# Patient Record
Sex: Female | Born: 1995
Health system: Southern US, Community
[De-identification: ages and names within clinical notes are randomized; demographics above are authoritative.]

## PROBLEM LIST (undated history)

## (undated) DIAGNOSIS — F419 Anxiety disorder, unspecified: Secondary | ICD-10-CM

## (undated) DIAGNOSIS — F32A Depression, unspecified: Secondary | ICD-10-CM

## (undated) DIAGNOSIS — T7840XA Allergy, unspecified, initial encounter: Secondary | ICD-10-CM

## (undated) DIAGNOSIS — F329 Major depressive disorder, single episode, unspecified: Secondary | ICD-10-CM

## (undated) HISTORY — DX: Depression, unspecified: F32.A

## (undated) HISTORY — DX: Allergy, unspecified, initial encounter: T78.40XA

## (undated) HISTORY — DX: Anxiety disorder, unspecified: F41.9

## (undated) HISTORY — PX: TONSILLECTOMY AND ADENOIDECTOMY: SUR1326

## (undated) HISTORY — DX: Major depressive disorder, single episode, unspecified: F32.9

---
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
## 2016-05-23 DIAGNOSIS — Z01419 Encounter for gynecological examination (general) (routine) without abnormal findings: Secondary | ICD-10-CM | POA: Diagnosis not present

## 2016-05-23 DIAGNOSIS — Z6841 Body Mass Index (BMI) 40.0 and over, adult: Secondary | ICD-10-CM | POA: Diagnosis not present

## 2016-05-23 DIAGNOSIS — Z113 Encounter for screening for infections with a predominantly sexual mode of transmission: Secondary | ICD-10-CM | POA: Diagnosis not present

## 2016-12-08 DIAGNOSIS — D485 Neoplasm of uncertain behavior of skin: Secondary | ICD-10-CM | POA: Diagnosis not present

## 2016-12-08 DIAGNOSIS — D225 Melanocytic nevi of trunk: Secondary | ICD-10-CM | POA: Diagnosis not present

## 2016-12-10 DIAGNOSIS — S99912A Unspecified injury of left ankle, initial encounter: Secondary | ICD-10-CM | POA: Diagnosis not present

## 2016-12-10 DIAGNOSIS — M25572 Pain in left ankle and joints of left foot: Secondary | ICD-10-CM | POA: Diagnosis not present

## 2016-12-10 DIAGNOSIS — Z6841 Body Mass Index (BMI) 40.0 and over, adult: Secondary | ICD-10-CM | POA: Diagnosis not present

## 2016-12-10 DIAGNOSIS — S93402A Sprain of unspecified ligament of left ankle, initial encounter: Secondary | ICD-10-CM | POA: Diagnosis not present

## 2016-12-10 DIAGNOSIS — M7989 Other specified soft tissue disorders: Secondary | ICD-10-CM | POA: Diagnosis not present

## 2017-01-29 DIAGNOSIS — J040 Acute laryngitis: Secondary | ICD-10-CM | POA: Diagnosis not present

## 2017-01-29 DIAGNOSIS — J40 Bronchitis, not specified as acute or chronic: Secondary | ICD-10-CM | POA: Diagnosis not present

## 2017-04-10 DIAGNOSIS — J3089 Other allergic rhinitis: Secondary | ICD-10-CM | POA: Diagnosis not present

## 2017-04-10 DIAGNOSIS — J3081 Allergic rhinitis due to animal (cat) (dog) hair and dander: Secondary | ICD-10-CM | POA: Diagnosis not present

## 2017-04-10 DIAGNOSIS — J301 Allergic rhinitis due to pollen: Secondary | ICD-10-CM | POA: Diagnosis not present

## 2017-04-10 DIAGNOSIS — R05 Cough: Secondary | ICD-10-CM | POA: Diagnosis not present

## 2017-04-17 DIAGNOSIS — J301 Allergic rhinitis due to pollen: Secondary | ICD-10-CM | POA: Diagnosis not present

## 2017-04-17 DIAGNOSIS — J3081 Allergic rhinitis due to animal (cat) (dog) hair and dander: Secondary | ICD-10-CM | POA: Diagnosis not present

## 2017-04-18 DIAGNOSIS — J3089 Other allergic rhinitis: Secondary | ICD-10-CM | POA: Diagnosis not present

## 2017-04-27 DIAGNOSIS — J3089 Other allergic rhinitis: Secondary | ICD-10-CM | POA: Diagnosis not present

## 2017-04-27 DIAGNOSIS — J3081 Allergic rhinitis due to animal (cat) (dog) hair and dander: Secondary | ICD-10-CM | POA: Diagnosis not present

## 2017-04-27 DIAGNOSIS — J301 Allergic rhinitis due to pollen: Secondary | ICD-10-CM | POA: Diagnosis not present

## 2017-05-01 DIAGNOSIS — J3081 Allergic rhinitis due to animal (cat) (dog) hair and dander: Secondary | ICD-10-CM | POA: Diagnosis not present

## 2017-05-01 DIAGNOSIS — J301 Allergic rhinitis due to pollen: Secondary | ICD-10-CM | POA: Diagnosis not present

## 2017-05-01 DIAGNOSIS — J3089 Other allergic rhinitis: Secondary | ICD-10-CM | POA: Diagnosis not present

## 2017-05-03 DIAGNOSIS — J301 Allergic rhinitis due to pollen: Secondary | ICD-10-CM | POA: Diagnosis not present

## 2017-05-03 DIAGNOSIS — J3081 Allergic rhinitis due to animal (cat) (dog) hair and dander: Secondary | ICD-10-CM | POA: Diagnosis not present

## 2017-05-03 DIAGNOSIS — J3089 Other allergic rhinitis: Secondary | ICD-10-CM | POA: Diagnosis not present

## 2017-05-10 DIAGNOSIS — J3089 Other allergic rhinitis: Secondary | ICD-10-CM | POA: Diagnosis not present

## 2017-05-10 DIAGNOSIS — J301 Allergic rhinitis due to pollen: Secondary | ICD-10-CM | POA: Diagnosis not present

## 2017-05-10 DIAGNOSIS — J3081 Allergic rhinitis due to animal (cat) (dog) hair and dander: Secondary | ICD-10-CM | POA: Diagnosis not present

## 2017-05-17 DIAGNOSIS — J301 Allergic rhinitis due to pollen: Secondary | ICD-10-CM | POA: Diagnosis not present

## 2017-05-17 DIAGNOSIS — J3089 Other allergic rhinitis: Secondary | ICD-10-CM | POA: Diagnosis not present

## 2017-05-17 DIAGNOSIS — J3081 Allergic rhinitis due to animal (cat) (dog) hair and dander: Secondary | ICD-10-CM | POA: Diagnosis not present

## 2017-05-19 DIAGNOSIS — J3089 Other allergic rhinitis: Secondary | ICD-10-CM | POA: Diagnosis not present

## 2017-05-19 DIAGNOSIS — J3081 Allergic rhinitis due to animal (cat) (dog) hair and dander: Secondary | ICD-10-CM | POA: Diagnosis not present

## 2017-05-19 DIAGNOSIS — J301 Allergic rhinitis due to pollen: Secondary | ICD-10-CM | POA: Diagnosis not present

## 2017-05-22 DIAGNOSIS — J3081 Allergic rhinitis due to animal (cat) (dog) hair and dander: Secondary | ICD-10-CM | POA: Diagnosis not present

## 2017-05-22 DIAGNOSIS — J3089 Other allergic rhinitis: Secondary | ICD-10-CM | POA: Diagnosis not present

## 2017-05-22 DIAGNOSIS — J301 Allergic rhinitis due to pollen: Secondary | ICD-10-CM | POA: Diagnosis not present

## 2017-05-25 DIAGNOSIS — J3081 Allergic rhinitis due to animal (cat) (dog) hair and dander: Secondary | ICD-10-CM | POA: Diagnosis not present

## 2017-05-25 DIAGNOSIS — J301 Allergic rhinitis due to pollen: Secondary | ICD-10-CM | POA: Diagnosis not present

## 2017-05-25 DIAGNOSIS — J3089 Other allergic rhinitis: Secondary | ICD-10-CM | POA: Diagnosis not present

## 2017-06-01 DIAGNOSIS — J3081 Allergic rhinitis due to animal (cat) (dog) hair and dander: Secondary | ICD-10-CM | POA: Diagnosis not present

## 2017-06-01 DIAGNOSIS — J3089 Other allergic rhinitis: Secondary | ICD-10-CM | POA: Diagnosis not present

## 2017-06-01 DIAGNOSIS — J301 Allergic rhinitis due to pollen: Secondary | ICD-10-CM | POA: Diagnosis not present

## 2017-06-07 DIAGNOSIS — J3089 Other allergic rhinitis: Secondary | ICD-10-CM | POA: Diagnosis not present

## 2017-06-07 DIAGNOSIS — J3081 Allergic rhinitis due to animal (cat) (dog) hair and dander: Secondary | ICD-10-CM | POA: Diagnosis not present

## 2017-06-07 DIAGNOSIS — J301 Allergic rhinitis due to pollen: Secondary | ICD-10-CM | POA: Diagnosis not present

## 2017-06-08 ENCOUNTER — Ambulatory Visit: Payer: Self-pay | Admitting: Physician Assistant

## 2017-06-17 DIAGNOSIS — Z30430 Encounter for insertion of intrauterine contraceptive device: Secondary | ICD-10-CM | POA: Diagnosis not present

## 2017-06-17 DIAGNOSIS — Z3202 Encounter for pregnancy test, result negative: Secondary | ICD-10-CM | POA: Diagnosis not present

## 2017-06-30 DIAGNOSIS — J3089 Other allergic rhinitis: Secondary | ICD-10-CM | POA: Diagnosis not present

## 2017-06-30 DIAGNOSIS — J3081 Allergic rhinitis due to animal (cat) (dog) hair and dander: Secondary | ICD-10-CM | POA: Diagnosis not present

## 2017-06-30 DIAGNOSIS — J301 Allergic rhinitis due to pollen: Secondary | ICD-10-CM | POA: Diagnosis not present

## 2017-07-05 ENCOUNTER — Encounter: Payer: Self-pay | Admitting: Physician Assistant

## 2017-07-07 DIAGNOSIS — J301 Allergic rhinitis due to pollen: Secondary | ICD-10-CM | POA: Diagnosis not present

## 2017-07-07 DIAGNOSIS — J3089 Other allergic rhinitis: Secondary | ICD-10-CM | POA: Diagnosis not present

## 2017-07-07 DIAGNOSIS — J3081 Allergic rhinitis due to animal (cat) (dog) hair and dander: Secondary | ICD-10-CM | POA: Diagnosis not present

## 2017-07-13 DIAGNOSIS — J3081 Allergic rhinitis due to animal (cat) (dog) hair and dander: Secondary | ICD-10-CM | POA: Diagnosis not present

## 2017-07-13 DIAGNOSIS — J301 Allergic rhinitis due to pollen: Secondary | ICD-10-CM | POA: Diagnosis not present

## 2017-07-13 DIAGNOSIS — J3089 Other allergic rhinitis: Secondary | ICD-10-CM | POA: Diagnosis not present

## 2017-07-19 ENCOUNTER — Ambulatory Visit (INDEPENDENT_AMBULATORY_CARE_PROVIDER_SITE_OTHER): Payer: BLUE CROSS/BLUE SHIELD | Admitting: Physician Assistant

## 2017-07-19 ENCOUNTER — Encounter: Payer: Self-pay | Admitting: Physician Assistant

## 2017-07-19 VITALS — BP 123/80 | HR 93 | Temp 98.0°F | Ht 65.0 in | Wt 321.8 lb

## 2017-07-19 DIAGNOSIS — Z6841 Body Mass Index (BMI) 40.0 and over, adult: Secondary | ICD-10-CM | POA: Diagnosis not present

## 2017-07-19 DIAGNOSIS — R635 Abnormal weight gain: Secondary | ICD-10-CM

## 2017-07-19 DIAGNOSIS — J3081 Allergic rhinitis due to animal (cat) (dog) hair and dander: Secondary | ICD-10-CM | POA: Diagnosis not present

## 2017-07-19 DIAGNOSIS — Z01419 Encounter for gynecological examination (general) (routine) without abnormal findings: Secondary | ICD-10-CM | POA: Diagnosis not present

## 2017-07-19 DIAGNOSIS — J3089 Other allergic rhinitis: Secondary | ICD-10-CM | POA: Diagnosis not present

## 2017-07-19 DIAGNOSIS — J301 Allergic rhinitis due to pollen: Secondary | ICD-10-CM | POA: Diagnosis not present

## 2017-07-19 MED ORDER — BUPROPION HCL ER (XL) 150 MG PO TB24: 150.0000 mg | ORAL_TABLET | Freq: Every day | ORAL | 1 refills | Status: DC

## 2017-07-19 MED ORDER — FLUOXETINE HCL 40 MG PO CAPS: ORAL_CAPSULE | ORAL | 3 refills | Status: DC

## 2017-07-19 NOTE — Patient Instructions (Signed)
Breakfast: eggs 2-3 Or greek yogurt low fat DANNON 1 slice delightful [employer] bread  Lunch: 2 slice [employer] delightfully bread       Or Nature's Own Light 4 ounces chicken, turkey, roast beef 1 slice Thin sliced cheese Sargento Mustard ok 1 piece of fruit  Supper: 6 ounces lean meat 2 cups raw/cooked veg or 1 cup pintos, corn lima  3 snacks 100 calories or less  

## 2017-07-20 ENCOUNTER — Telehealth: Payer: Self-pay | Admitting: Physician Assistant

## 2017-07-20 ENCOUNTER — Other Ambulatory Visit: Payer: BLUE CROSS/BLUE SHIELD

## 2017-07-20 DIAGNOSIS — R635 Abnormal weight gain: Secondary | ICD-10-CM

## 2017-07-20 DIAGNOSIS — Z01419 Encounter for gynecological examination (general) (routine) without abnormal findings: Secondary | ICD-10-CM

## 2017-07-20 NOTE — Progress Notes (Signed)
BP 123/80   Pulse 93   Temp 98 F (36.7 C) (Oral)   Ht 5' 5"  (1.651 m)   Wt (!) 321 lb 12.8 oz (146 kg)   BMI 53.55 kg/m    Subjective:    Patient ID: Tiffany Dalton, female    DOB: May 06, 1996, 21 y.o.   MRN: [id number]  HPI: AMEILA Dalton is a 21 y.o. female presenting on 07/19/2017 for New Patient (Initial Visit) and Annual Exam  This patient comes in for annual well physical examination. All medications are reviewed today. There are no reports of any problems with the medications. All of the medical conditions are reviewed and updated.  Lab work is reviewed and will be ordered as medically necessary.   Patient reports that she was stopped on her birth control pill by the gynecologist at [facility] when she was in college.  She is just graduated this spring.  She is living back in the area now.  She had a Mirena placed.  It has been okay so far.  Her first.  Was much lighter.  And she can tell that her headaches are better.  She is also had issues with weight gain and would really like to work on this issue.  She admits to emotional eating.  We have had a discussion of what Wellbutrin can do to help this and she has never taken this before.  We have also have the discussion about insulin resistance and we will have labs drawn today.  Relevant past medical, surgical, family and social history reviewed and updated as indicated. Allergies and medications reviewed and updated.  Past Medical History:  Diagnosis Date  . Allergy   . Anxiety   . Depression     Past Surgical History:  Procedure Laterality Date  . TONSILLECTOMY AND ADENOIDECTOMY      Review of Systems  Constitutional: Positive for unexpected weight change. Negative for activity change, fatigue and fever.  HENT: Negative.   Eyes: Negative.   Respiratory: Negative.  Negative for cough.   Cardiovascular: Negative.  Negative for chest pain.  Gastrointestinal: Negative.  Negative for abdominal pain.  Endocrine:  Negative.  Negative for cold intolerance, heat intolerance, polydipsia, polyphagia and polyuria.  Genitourinary: Negative.  Negative for dysuria.  Musculoskeletal: Negative.   Skin: Negative.   Neurological: Negative.     Allergies as of 07/19/2017   No Known Allergies     Medication List        Accurate as of 07/19/17 11:59 PM. Always use your most recent med list.          buPROPion 150 MG 24 hr tablet Commonly known as:  WELLBUTRIN XL Take 1-2 tablets (150-300 mg total) by mouth daily.   FLUoxetine 40 MG capsule Commonly known as:  PROZAC fluoxetine 40 mg capsule  TAKE ONE CAPSULE BY MOUTH EVERY DAY, rx   levocetirizine 5 MG tablet Commonly known as:  XYZAL   levonorgestrel 20 MCG/24HR IUD Commonly known as:  MIRENA 1 each by Intrauterine route once.   UNABLE TO FIND Med Name: allergy injections twice weekly          Objective:    BP 123/80   Pulse 93   Temp 98 F (36.7 C) (Oral)   Ht 5' 5"  (1.651 m)   Wt (!) 321 lb 12.8 oz (146 kg)   BMI 53.55 kg/m   No Known Allergies  Physical Exam  Constitutional: She is oriented to person, place, and time.  She appears well-developed and well-nourished. No distress.  HENT:  Head: Normocephalic and atraumatic.  Eyes: Conjunctivae and EOM are normal. Pupils are equal, round, and reactive to light.  Neck: Normal range of motion. Neck supple.  Cardiovascular: Normal rate, regular rhythm, normal heart sounds and intact distal pulses.  Pulmonary/Chest: Effort normal and breath sounds normal. Right breast exhibits no mass, no skin change and no tenderness. Left breast exhibits no mass, no skin change and no tenderness. Breasts are symmetrical.  Abdominal: Soft. Bowel sounds are normal.  Genitourinary: Vagina normal and uterus normal. Rectal exam shows no fissure. No breast swelling, tenderness, discharge or bleeding. There is no tenderness or lesion on the right labia. There is no tenderness or lesion on the left labia.  Uterus is not deviated, not enlarged and not tender. Cervix exhibits no motion tenderness, no discharge and no friability. Right adnexum displays no mass, no tenderness and no fullness. Left adnexum displays no mass, no tenderness and no fullness. No tenderness or bleeding in the vagina. No vaginal discharge found.  Neurological: She is alert and oriented to person, place, and time. She has normal reflexes.  Skin: Skin is warm and dry. No rash noted. She is not diaphoretic.  Psychiatric: She has a normal mood and affect. Her behavior is normal. Judgment and thought content normal.        Assessment & Plan:   1. Well female exam with routine gynecological exam - levonorgestrel (MIRENA) 20 MCG/24HR IUD; 1 each by Intrauterine route once. - CBC with Differential/Platelet; Future - CMP14+EGFR; Future - Lipid panel; Future - TSH; Future - Insulin, random; Future - IGP, CtNg, rfx Aptima HPV ASCU  2. Weight gain - buPROPion (WELLBUTRIN XL) 150 MG 24 hr tablet; Take 1-2 tablets (150-300 mg total) by mouth daily.  Dispense: 60 tablet; Refill: 1 - Insulin, random; Future  3. BMI 50.0-59.9, adult [facility])    Current Outpatient Medications:  .  buPROPion (WELLBUTRIN XL) 150 MG 24 hr tablet, Take 1-2 tablets (150-300 mg total) by mouth daily., Disp: 60 tablet, Rfl: 1 .  FLUoxetine (PROZAC) 40 MG capsule, fluoxetine 40 mg capsule  TAKE ONE CAPSULE BY MOUTH EVERY DAY, rx, Disp: 90 capsule, Rfl: 3 .  levocetirizine (XYZAL) 5 MG tablet, , Disp: , Rfl:  .  levonorgestrel (MIRENA) 20 MCG/24HR IUD, 1 each by Intrauterine route once., Disp: , Rfl:  .  UNABLE TO FIND, Med Name: allergy injections twice weekly, Disp: , Rfl:  Continue all other maintenance medications as listed above.  Follow up plan: Recheck in 4 weeks  Educational handout given for Judson PA-C [place] [address]  [place], [place] [address] [phone]   07/20/2017, 9:56 AM

## 2017-07-20 NOTE — Telephone Encounter (Signed)
Patient aware that rx was sent to pharmacy yesterday.  

## 2017-07-21 DIAGNOSIS — J3081 Allergic rhinitis due to animal (cat) (dog) hair and dander: Secondary | ICD-10-CM | POA: Diagnosis not present

## 2017-07-21 DIAGNOSIS — J301 Allergic rhinitis due to pollen: Secondary | ICD-10-CM | POA: Diagnosis not present

## 2017-07-21 DIAGNOSIS — J3089 Other allergic rhinitis: Secondary | ICD-10-CM | POA: Diagnosis not present

## 2017-07-21 LAB — CMP14+EGFR
A/G RATIO: 2 (ref 1.2–2.2)
ALT: 32 IU/L (ref 0–32)
AST: 20 IU/L (ref 0–40)
Albumin: 4.3 g/dL (ref 3.5–5.5)
Alkaline Phosphatase: 60 IU/L (ref 39–117)
BUN/Creatinine Ratio: 17 (ref 9–23)
BUN: 9 mg/dL (ref 6–20)
Bilirubin Total: 0.4 mg/dL (ref 0.0–1.2)
CALCIUM: 9.3 mg/dL (ref 8.7–10.2)
CO2: 24 mmol/L (ref 20–29)
Chloride: 100 mmol/L (ref 96–106)
Creatinine, Ser: 0.52 mg/dL — ABNORMAL LOW (ref 0.57–1.00)
GFR, EST AFRICAN AMERICAN: 157 mL/min/{1.73_m2} (ref >59)
GFR, EST NON AFRICAN AMERICAN: 136 mL/min/{1.73_m2} (ref >59)
GLOBULIN, TOTAL: 2.2 g/dL (ref 1.5–4.5)
Glucose: 85 mg/dL (ref 65–99)
POTASSIUM: 4.5 mmol/L (ref 3.5–5.2)
SODIUM: 139 mmol/L (ref 134–144)
TOTAL PROTEIN: 6.5 g/dL (ref 6.0–8.5)

## 2017-07-21 LAB — CBC WITH DIFFERENTIAL/PLATELET
BASOS: 1 %
Basophils Absolute: 0 10*3/uL (ref 0.0–0.2)
EOS (ABSOLUTE): 0.2 10*3/uL (ref 0.0–0.4)
EOS: 3 %
HEMATOCRIT: 40.6 % (ref 34.0–46.6)
Hemoglobin: 13.4 g/dL (ref 11.1–15.9)
IMMATURE GRANS (ABS): 0 10*3/uL (ref 0.0–0.1)
Immature Granulocytes: 0 %
LYMPHS: 35 %
Lymphocytes Absolute: 2.2 10*3/uL (ref 0.7–3.1)
MCH: 26.7 pg (ref 26.6–33.0)
MCHC: 33 g/dL (ref 31.5–35.7)
MCV: 81 fL (ref 79–97)
MONOS ABS: 0.3 10*3/uL (ref 0.1–0.9)
Monocytes: 5 %
NEUTROS ABS: 3.7 10*3/uL (ref 1.4–7.0)
Neutrophils: 56 %
PLATELETS: 338 10*3/uL (ref 150–379)
RBC: 5.01 x10E6/uL (ref 3.77–5.28)
RDW: 13.9 % (ref 12.3–15.4)
WBC: 6.4 10*3/uL (ref 3.4–10.8)

## 2017-07-21 LAB — INSULIN, RANDOM: INSULIN: 34.5 u[IU]/mL — AB (ref 2.6–24.9)

## 2017-07-21 LAB — LIPID PANEL
CHOLESTEROL TOTAL: 164 mg/dL (ref 100–199)
Chol/HDL Ratio: 4.1 ratio (ref 0.0–4.4)
HDL: 40 mg/dL (ref >39)
LDL Calculated: 100 mg/dL — ABNORMAL HIGH (ref 0–99)
TRIGLYCERIDES: 119 mg/dL (ref 0–149)
VLDL Cholesterol Cal: 24 mg/dL (ref 5–40)

## 2017-07-21 LAB — TSH: TSH: 2.22 u[IU]/mL (ref 0.450–4.500)

## 2017-07-22 LAB — IGP, CTNG, RFX APTIMA HPV ASCU
Chlamydia, Nuc. Acid Amp: NEGATIVE
GONOCOCCUS BY NUCLEIC ACID AMP: NEGATIVE
PAP Smear Comment: 0

## 2017-07-26 DIAGNOSIS — J3089 Other allergic rhinitis: Secondary | ICD-10-CM | POA: Diagnosis not present

## 2017-07-26 DIAGNOSIS — J3081 Allergic rhinitis due to animal (cat) (dog) hair and dander: Secondary | ICD-10-CM | POA: Diagnosis not present

## 2017-07-26 DIAGNOSIS — J301 Allergic rhinitis due to pollen: Secondary | ICD-10-CM | POA: Diagnosis not present

## 2017-07-28 DIAGNOSIS — J3089 Other allergic rhinitis: Secondary | ICD-10-CM | POA: Diagnosis not present

## 2017-07-28 DIAGNOSIS — J3081 Allergic rhinitis due to animal (cat) (dog) hair and dander: Secondary | ICD-10-CM | POA: Diagnosis not present

## 2017-07-28 DIAGNOSIS — J301 Allergic rhinitis due to pollen: Secondary | ICD-10-CM | POA: Diagnosis not present

## 2017-08-02 DIAGNOSIS — J301 Allergic rhinitis due to pollen: Secondary | ICD-10-CM | POA: Diagnosis not present

## 2017-08-02 DIAGNOSIS — J3081 Allergic rhinitis due to animal (cat) (dog) hair and dander: Secondary | ICD-10-CM | POA: Diagnosis not present

## 2017-08-02 DIAGNOSIS — J3089 Other allergic rhinitis: Secondary | ICD-10-CM | POA: Diagnosis not present

## 2017-08-07 DIAGNOSIS — J301 Allergic rhinitis due to pollen: Secondary | ICD-10-CM | POA: Diagnosis not present

## 2017-08-07 DIAGNOSIS — J3081 Allergic rhinitis due to animal (cat) (dog) hair and dander: Secondary | ICD-10-CM | POA: Diagnosis not present

## 2017-08-07 DIAGNOSIS — J3089 Other allergic rhinitis: Secondary | ICD-10-CM | POA: Diagnosis not present

## 2017-08-10 DIAGNOSIS — J3089 Other allergic rhinitis: Secondary | ICD-10-CM | POA: Diagnosis not present

## 2017-08-10 DIAGNOSIS — J301 Allergic rhinitis due to pollen: Secondary | ICD-10-CM | POA: Diagnosis not present

## 2017-08-10 DIAGNOSIS — J3081 Allergic rhinitis due to animal (cat) (dog) hair and dander: Secondary | ICD-10-CM | POA: Diagnosis not present

## 2017-08-14 DIAGNOSIS — J3089 Other allergic rhinitis: Secondary | ICD-10-CM | POA: Diagnosis not present

## 2017-08-14 DIAGNOSIS — J3081 Allergic rhinitis due to animal (cat) (dog) hair and dander: Secondary | ICD-10-CM | POA: Diagnosis not present

## 2017-08-14 DIAGNOSIS — J301 Allergic rhinitis due to pollen: Secondary | ICD-10-CM | POA: Diagnosis not present

## 2017-08-18 ENCOUNTER — Ambulatory Visit (INDEPENDENT_AMBULATORY_CARE_PROVIDER_SITE_OTHER): Payer: BLUE CROSS/BLUE SHIELD | Admitting: Physician Assistant

## 2017-08-18 ENCOUNTER — Encounter: Payer: Self-pay | Admitting: Physician Assistant

## 2017-08-18 VITALS — BP 136/89 | HR 90 | Temp 95.5°F | Ht 65.0 in | Wt 310.4 lb

## 2017-08-18 DIAGNOSIS — E161 Other hypoglycemia: Secondary | ICD-10-CM

## 2017-08-18 DIAGNOSIS — Z6841 Body Mass Index (BMI) 40.0 and over, adult: Secondary | ICD-10-CM | POA: Diagnosis not present

## 2017-08-18 DIAGNOSIS — R635 Abnormal weight gain: Secondary | ICD-10-CM

## 2017-08-18 MED ORDER — METFORMIN HCL 500 MG PO TABS: 500.0000 mg | ORAL_TABLET | Freq: Every day | ORAL | 0 refills | Status: DC

## 2017-08-18 NOTE — Patient Instructions (Signed)
Pure protein shake lo carb 20 g protein

## 2017-08-18 NOTE — Progress Notes (Signed)
BP 136/89   Pulse 90   Temp (!) 95.5 F (35.3 C) (Oral)   Ht 5' 5"  (1.651 m)   Wt (!) 310 lb 6.4 oz (140.8 kg)   BMI 51.65 kg/m    Subjective:    Patient ID: Tiffany Dalton, female    DOB: 09/17/1995, 22 y.o.   MRN: [id number]  HPI: Tiffany Dalton is a 22 y.o. female presenting on 08/18/2017 for Follow-up (4 week rck )  Medication.  She has lost 12 pounds in the past month.  She is tolerating the Wellbutrin 300 mg exceptionally well.  We have had a discussion about hyperinsulinemia and insulin resistance.  She states that she is up for trying metformin 1 daily.  Encouraged to continue with her diet and exercise and commended her on a job well done.  Relevant past medical, surgical, family and social history reviewed and updated as indicated. Allergies and medications reviewed and updated.  Past Medical History:  Diagnosis Date  . Allergy   . Anxiety   . Depression     Past Surgical History:  Procedure Laterality Date  . TONSILLECTOMY AND ADENOIDECTOMY      Review of Systems  Constitutional: Negative.   HENT: Negative.   Eyes: Negative.   Respiratory: Negative.   Gastrointestinal: Negative.   Genitourinary: Negative.     Allergies as of 08/18/2017   No Known Allergies     Medication List        Accurate as of 08/18/17  4:45 PM. Always use your most recent med list.          buPROPion 150 MG 24 hr tablet Commonly known as:  WELLBUTRIN XL Take 1-2 tablets (150-300 mg total) by mouth daily.   FLUoxetine 40 MG capsule Commonly known as:  PROZAC fluoxetine 40 mg capsule  TAKE ONE CAPSULE BY MOUTH EVERY DAY, rx   levocetirizine 5 MG tablet Commonly known as:  XYZAL   levonorgestrel 20 MCG/24HR IUD Commonly known as:  MIRENA 1 each by Intrauterine route once.   metFORMIN 500 MG tablet Commonly known as:  GLUCOPHAGE Take 1 tablet (500 mg total) by mouth daily.   UNABLE TO FIND Med Name: allergy injections twice weekly          Objective:    BP  136/89   Pulse 90   Temp (!) 95.5 F (35.3 C) (Oral)   Ht 5' 5"  (1.651 m)   Wt (!) 310 lb 6.4 oz (140.8 kg)   BMI 51.65 kg/m   No Known Allergies  Physical Exam  Constitutional: She is oriented to person, place, and time. She appears well-developed and well-nourished.  HENT:  Head: Normocephalic and atraumatic.  Eyes: Conjunctivae and EOM are normal. Pupils are equal, round, and reactive to light.  Cardiovascular: Normal rate, regular rhythm, normal heart sounds and intact distal pulses.  Pulmonary/Chest: Effort normal and breath sounds normal.  Abdominal: Soft. Bowel sounds are normal.  Neurological: She is alert and oriented to person, place, and time. She has normal reflexes.  Skin: Skin is warm and dry. No rash noted.  Psychiatric: She has a normal mood and affect. Her behavior is normal. Judgment and thought content normal.  Nursing note and vitals reviewed.   Results for orders placed or performed in visit on 07/20/17  Insulin, random  Result Value Ref Range   INSULIN 34.5 (H) 2.6 - 24.9 uIU/mL  TSH  Result Value Ref Range   TSH 2.220 0.450 - 4.500 uIU/mL  Lipid panel  Result Value Ref Range   Cholesterol, Total 164 100 - 199 mg/dL   Triglycerides 119 0 - 149 mg/dL   HDL 40 >39 mg/dL   VLDL Cholesterol Cal 24 5 - 40 mg/dL   LDL Calculated 100 (H) 0 - 99 mg/dL   Chol/HDL Ratio 4.1 0.0 - 4.4 ratio  CMP14+EGFR  Result Value Ref Range   Glucose 85 65 - 99 mg/dL   BUN 9 6 - 20 mg/dL   Creatinine, Ser 0.52 (L) 0.57 - 1.00 mg/dL   GFR calc non Af Amer 136 >59 mL/min/1.73   GFR calc Af Amer 157 >59 mL/min/1.73   BUN/Creatinine Ratio 17 9 - 23   Sodium 139 134 - 144 mmol/L   Potassium 4.5 3.5 - 5.2 mmol/L   Chloride 100 96 - 106 mmol/L   CO2 24 20 - 29 mmol/L   Calcium 9.3 8.7 - 10.2 mg/dL   Total Protein 6.5 6.0 - 8.5 g/dL   Albumin 4.3 3.5 - 5.5 g/dL   Globulin, Total 2.2 1.5 - 4.5 g/dL   Albumin/Globulin Ratio 2.0 1.2 - 2.2   Bilirubin Total 0.4 0.0 - 1.2 mg/dL    Alkaline Phosphatase 60 39 - 117 IU/L   AST 20 0 - 40 IU/L   ALT 32 0 - 32 IU/L  CBC with Differential/Platelet  Result Value Ref Range   WBC 6.4 3.4 - 10.8 x10E3/uL   RBC 5.01 3.77 - 5.28 x10E6/uL   Hemoglobin 13.4 11.1 - 15.9 g/dL   Hematocrit 40.6 34.0 - 46.6 %   MCV 81 79 - 97 fL   MCH 26.7 26.6 - 33.0 pg   MCHC 33.0 31.5 - 35.7 g/dL   RDW 13.9 12.3 - 15.4 %   Platelets 338 150 - 379 x10E3/uL   Neutrophils 56 Not Estab. %   Lymphs 35 Not Estab. %   Monocytes 5 Not Estab. %   Eos 3 Not Estab. %   Basos 1 Not Estab. %   Neutrophils Absolute 3.7 1.4 - 7.0 x10E3/uL   Lymphocytes Absolute 2.2 0.7 - 3.1 x10E3/uL   Monocytes Absolute 0.3 0.1 - 0.9 x10E3/uL   EOS (ABSOLUTE) 0.2 0.0 - 0.4 x10E3/uL   Basophils Absolute 0.0 0.0 - 0.2 x10E3/uL   Immature Granulocytes 0 Not Estab. %   Immature Grans (Abs) 0.0 0.0 - 0.1 x10E3/uL      Assessment & Plan:   1. Weight gain - metFORMIN (GLUCOPHAGE) 500 MG tablet; Take 1 tablet (500 mg total) by mouth daily.  Dispense: 90 tablet; Refill: 0  2. BMI 50.0-59.9, adult ([place])  3. Hyperinsulinemia - metFORMIN (GLUCOPHAGE) 500 MG tablet; Take 1 tablet (500 mg total) by mouth daily.  Dispense: 90 tablet; Refill: 0    Current Outpatient Medications:  .  buPROPion (WELLBUTRIN XL) 150 MG 24 hr tablet, Take 1-2 tablets (150-300 mg total) by mouth daily., Disp: 60 tablet, Rfl: 1 .  FLUoxetine (PROZAC) 40 MG capsule, fluoxetine 40 mg capsule  TAKE ONE CAPSULE BY MOUTH EVERY DAY, rx, Disp: 90 capsule, Rfl: 3 .  levocetirizine (XYZAL) 5 MG tablet, , Disp: , Rfl:  .  levonorgestrel (MIRENA) 20 MCG/24HR IUD, 1 each by Intrauterine route once., Disp: , Rfl:  .  metFORMIN (GLUCOPHAGE) 500 MG tablet, Take 1 tablet (500 mg total) by mouth daily., Disp: 90 tablet, Rfl: 0 .  UNABLE TO FIND, Med Name: allergy injections twice weekly, Disp: , Rfl:  Continue all other maintenance medications as listed above.  Follow up plan: Return in about 4 weeks (around  09/15/2017) for recheck.  Educational handout given for [place] PA-C [place] [address]  [place], [place] [address] [phone]   08/18/2017, 4:45 PM  Patient comes in for recheck on

## 2017-08-24 DIAGNOSIS — J301 Allergic rhinitis due to pollen: Secondary | ICD-10-CM | POA: Diagnosis not present

## 2017-08-24 DIAGNOSIS — J3089 Other allergic rhinitis: Secondary | ICD-10-CM | POA: Diagnosis not present

## 2017-08-24 DIAGNOSIS — J3081 Allergic rhinitis due to animal (cat) (dog) hair and dander: Secondary | ICD-10-CM | POA: Diagnosis not present

## 2017-08-30 DIAGNOSIS — J3089 Other allergic rhinitis: Secondary | ICD-10-CM | POA: Diagnosis not present

## 2017-08-30 DIAGNOSIS — J3081 Allergic rhinitis due to animal (cat) (dog) hair and dander: Secondary | ICD-10-CM | POA: Diagnosis not present

## 2017-08-30 DIAGNOSIS — J301 Allergic rhinitis due to pollen: Secondary | ICD-10-CM | POA: Diagnosis not present

## 2017-09-15 ENCOUNTER — Ambulatory Visit: Payer: BLUE CROSS/BLUE SHIELD | Admitting: Physician Assistant

## 2017-09-20 ENCOUNTER — Telehealth: Payer: Self-pay | Admitting: Physician Assistant

## 2017-09-20 DIAGNOSIS — R635 Abnormal weight gain: Secondary | ICD-10-CM

## 2017-09-20 MED ORDER — BUPROPION HCL ER (XL) 150 MG PO TB24: 150.0000 mg | ORAL_TABLET | Freq: Every day | ORAL | 1 refills | Status: DC

## 2017-09-20 NOTE — Telephone Encounter (Signed)
Refill sent to pharmacy pt aware  

## 2017-09-29 ENCOUNTER — Ambulatory Visit: Payer: BLUE CROSS/BLUE SHIELD | Admitting: Physician Assistant

## 2017-10-13 ENCOUNTER — Encounter: Payer: Self-pay | Admitting: Physician Assistant

## 2017-10-13 ENCOUNTER — Ambulatory Visit: Payer: BLUE CROSS/BLUE SHIELD | Admitting: Physician Assistant

## 2017-10-13 VITALS — BP 123/84 | HR 89 | Temp 97.3°F | Ht 65.0 in | Wt 301.4 lb

## 2017-10-13 DIAGNOSIS — Z6841 Body Mass Index (BMI) 40.0 and over, adult: Secondary | ICD-10-CM | POA: Diagnosis not present

## 2017-10-13 DIAGNOSIS — R635 Abnormal weight gain: Secondary | ICD-10-CM

## 2017-10-13 DIAGNOSIS — E161 Other hypoglycemia: Secondary | ICD-10-CM

## 2017-10-13 MED ORDER — FLUOXETINE HCL 40 MG PO CAPS: ORAL_CAPSULE | ORAL | 3 refills | Status: DC

## 2017-10-13 MED ORDER — BUPROPION HCL ER (XL) 150 MG PO TB24
150.0000 mg | ORAL_TABLET | Freq: Every day | ORAL | 1 refills | Status: DC
Start: 2017-10-13 — End: 2017-10-23

## 2017-10-13 MED ORDER — METFORMIN HCL 500 MG PO TABS: 500.0000 mg | ORAL_TABLET | Freq: Every day | ORAL | 1 refills | Status: DC

## 2017-10-13 NOTE — Patient Instructions (Signed)
In a few days you may receive a survey in the mail or online from [employer] regarding your visit with us today. Please take a moment to fill this out. Your feedback is very important to our whole office. It can help us better understand your needs as well as improve your experience and satisfaction. Thank you for taking your time to complete it. We care about you.  Matai Carpenito, PA-C  

## 2017-10-15 NOTE — Progress Notes (Signed)
BP 123/84   Pulse 89   Temp (!) 97.3 F (36.3 C) (Oral)   Ht 5' 5"  (1.651 m)   Wt (!) 301 lb 6.4 oz (136.7 kg)   BMI 50.16 kg/m    Subjective:    Patient ID: Tiffany Dalton, female    DOB: [birth year], 22 y.o.   MRN: [id number]  HPI: Tiffany Dalton is a 22 y.o. female presenting on 10/13/2017 for Follow-up (4 week rck)  This patient comes in for periodic recheck on medications and conditions including weight los efforts. She is doing very well and down another 8 pounds since her last visit. 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 The medication has done very well.  All medications are reviewed today. There are no reports of any problems with the medications. All of the medical conditions are reviewed and updated.  Lab work is reviewed and will be ordered as medically necessary. There are no new problems reported with today's visit.   Past Medical History:  Diagnosis Date  . Allergy   . Anxiety   . Depression    Relevant past medical, surgical, family and social history reviewed and updated as indicated. Interim medical history since our last visit reviewed. Allergies and medications reviewed and updated. DATA REVIEWED: CHART IN EPIC  Family History reviewed for pertinent findings.  Review of Systems  Constitutional: Negative.  Negative for activity change, fatigue and fever.  HENT: Negative.   Eyes: Negative.   Respiratory: Negative.  Negative for cough.   Cardiovascular: Negative.  Negative for chest pain.  Gastrointestinal: Negative.  Negative for abdominal pain.  Endocrine: Negative.   Genitourinary: Negative.  Negative for dysuria.  Musculoskeletal: Negative.   Skin: Negative.   Neurological: Negative.     Allergies as of 10/13/2017   No Known Allergies     Medication List        Accurate as of 10/13/17 11:59 PM. Always use your most recent med list.          buPROPion 150 MG 24 hr tablet Commonly known as:  WELLBUTRIN XL Take 1-2 tablets  (150-300 mg total) by mouth daily.   FLUoxetine 40 MG capsule Commonly known as:  PROZAC fluoxetine 40 mg capsule  TAKE ONE CAPSULE BY MOUTH EVERY DAY, rx   levocetirizine 5 MG tablet Commonly known as:  XYZAL   levonorgestrel 20 MCG/24HR IUD Commonly known as:  MIRENA 1 each by Intrauterine route once.   metFORMIN 500 MG tablet Commonly known as:  GLUCOPHAGE Take 1 tablet (500 mg total) by mouth daily.   UNABLE TO FIND Med Name: allergy injections twice weekly          Objective:    BP 123/84   Pulse 89   Temp (!) 97.3 F (36.3 C) (Oral)   Ht 5' 5"  (1.651 m)   Wt (!) 301 lb 6.4 oz (136.7 kg)   BMI 50.16 kg/m   No Known Allergies  Wt Readings from Last 3 Encounters:  10/13/17 (!) 301 lb 6.4 oz (136.7 kg)  08/18/17 (!) 310 lb 6.4 oz (140.8 kg)  07/19/17 (!) 321 lb 12.8 oz (146 kg)    Physical Exam  Constitutional: She is oriented to person, place, and time. She appears well-developed and well-nourished.  HENT:  Head: Normocephalic and atraumatic.  Eyes: Conjunctivae and EOM are normal. Pupils are equal, round, and reactive to light.  Cardiovascular: Normal rate, regular rhythm, normal heart sounds and intact distal pulses.  Pulmonary/Chest: Effort normal and breath sounds normal.  Abdominal: Soft. Bowel sounds are normal.  Neurological: She is alert and oriented to person, place, and time. She has normal reflexes.  Skin: Skin is warm and dry. No rash noted.  Psychiatric: She has a normal mood and affect. Her behavior is normal. Judgment and thought content normal.    Results for orders placed or performed in visit on 07/20/17  Insulin, random  Result Value Ref Range   INSULIN 34.5 (H) 2.6 - 24.9 uIU/mL  TSH  Result Value Ref Range   TSH 2.220 0.450 - 4.500 uIU/mL  Lipid panel  Result Value Ref Range   Cholesterol, Total 164 100 - 199 mg/dL   Triglycerides 119 0 - 149 mg/dL   HDL 40 >39 mg/dL   VLDL Cholesterol Cal 24 5 - 40 mg/dL   LDL Calculated 100  (H) 0 - 99 mg/dL   Chol/HDL Ratio 4.1 0.0 - 4.4 ratio  CMP14+EGFR  Result Value Ref Range   Glucose 85 65 - 99 mg/dL   BUN 9 6 - 20 mg/dL   Creatinine, Ser 0.52 (L) 0.57 - 1.00 mg/dL   GFR calc non Af Amer 136 >59 mL/min/1.73   GFR calc Af Amer 157 >59 mL/min/1.73   BUN/Creatinine Ratio 17 9 - 23   Sodium 139 134 - 144 mmol/L   Potassium 4.5 3.5 - 5.2 mmol/L   Chloride 100 96 - 106 mmol/L   CO2 24 20 - 29 mmol/L   Calcium 9.3 8.7 - 10.2 mg/dL   Total Protein 6.5 6.0 - 8.5 g/dL   Albumin 4.3 3.5 - 5.5 g/dL   Globulin, Total 2.2 1.5 - 4.5 g/dL   Albumin/Globulin Ratio 2.0 1.2 - 2.2   Bilirubin Total 0.4 0.0 - 1.2 mg/dL   Alkaline Phosphatase 60 39 - 117 IU/L   AST 20 0 - 40 IU/L   ALT 32 0 - 32 IU/L  CBC with Differential/Platelet  Result Value Ref Range   WBC 6.4 3.4 - 10.8 x10E3/uL   RBC 5.01 3.77 - 5.28 x10E6/uL   Hemoglobin 13.4 11.1 - 15.9 g/dL   Hematocrit 40.6 34.0 - 46.6 %   MCV 81 79 - 97 fL   MCH 26.7 26.6 - 33.0 pg   MCHC 33.0 31.5 - 35.7 g/dL   RDW 13.9 12.3 - 15.4 %   Platelets 338 150 - 379 x10E3/uL   Neutrophils 56 Not Estab. %   Lymphs 35 Not Estab. %   Monocytes 5 Not Estab. %   Eos 3 Not Estab. %   Basos 1 Not Estab. %   Neutrophils Absolute 3.7 1.4 - 7.0 x10E3/uL   Lymphocytes Absolute 2.2 0.7 - 3.1 x10E3/uL   Monocytes Absolute 0.3 0.1 - 0.9 x10E3/uL   EOS (ABSOLUTE) 0.2 0.0 - 0.4 x10E3/uL   Basophils Absolute 0.0 0.0 - 0.2 x10E3/uL   Immature Granulocytes 0 Not Estab. %   Immature Grans (Abs) 0.0 0.0 - 0.1 x10E3/uL      Assessment & Plan:   1. Weight gain - buPROPion (WELLBUTRIN XL) 150 MG 24 hr tablet; Take 1-2 tablets (150-300 mg total) by mouth daily.  Dispense: 180 tablet; Refill: 1 - metFORMIN (GLUCOPHAGE) 500 MG tablet; Take 1 tablet (500 mg total) by mouth daily.  Dispense: 90 tablet; Refill: 1  2. Hyperinsulinemia - metFORMIN (GLUCOPHAGE) 500 MG tablet; Take 1 tablet (500 mg total) by mouth daily.  Dispense: 90 tablet; Refill:  1   Continue all other maintenance  medications as listed above.  Follow up plan: Return in about 2 weeks (around 10/27/2017).  Educational handout given for [place]ita PA-C [place] [address]  [place], [place] [address] [phone]   10/15/2017, 3:57 PM

## 2017-10-23 ENCOUNTER — Other Ambulatory Visit: Payer: Self-pay | Admitting: Physician Assistant

## 2017-10-23 DIAGNOSIS — R635 Abnormal weight gain: Secondary | ICD-10-CM

## 2017-11-08 LAB — HM DIABETES EYE EXAM

## 2017-12-13 ENCOUNTER — Ambulatory Visit: Payer: BLUE CROSS/BLUE SHIELD | Admitting: Physician Assistant

## 2017-12-28 ENCOUNTER — Ambulatory Visit: Payer: BLUE CROSS/BLUE SHIELD | Admitting: Physician Assistant

## 2018-04-06 ENCOUNTER — Other Ambulatory Visit: Payer: Self-pay | Admitting: Physician Assistant

## 2018-04-06 DIAGNOSIS — R635 Abnormal weight gain: Secondary | ICD-10-CM

## 2018-09-25 DIAGNOSIS — Z975 Presence of (intrauterine) contraceptive device: Secondary | ICD-10-CM | POA: Diagnosis not present

## 2018-09-25 DIAGNOSIS — J029 Acute pharyngitis, unspecified: Secondary | ICD-10-CM | POA: Diagnosis not present

## 2018-09-25 DIAGNOSIS — J069 Acute upper respiratory infection, unspecified: Secondary | ICD-10-CM | POA: Diagnosis not present

## 2018-09-25 DIAGNOSIS — Z79899 Other long term (current) drug therapy: Secondary | ICD-10-CM | POA: Diagnosis not present

## 2018-09-25 DIAGNOSIS — H65191 Other acute nonsuppurative otitis media, right ear: Secondary | ICD-10-CM | POA: Diagnosis not present

## 2018-10-05 ENCOUNTER — Encounter: Payer: Self-pay | Admitting: Physician Assistant

## 2018-10-05 ENCOUNTER — Ambulatory Visit (INDEPENDENT_AMBULATORY_CARE_PROVIDER_SITE_OTHER): Payer: BLUE CROSS/BLUE SHIELD | Admitting: Physician Assistant

## 2018-10-05 VITALS — BP 126/88 | HR 103 | Temp 96.7°F | Wt 334.8 lb

## 2018-10-05 DIAGNOSIS — R635 Abnormal weight gain: Secondary | ICD-10-CM

## 2018-10-05 DIAGNOSIS — Z0000 Encounter for general adult medical examination without abnormal findings: Secondary | ICD-10-CM

## 2018-10-05 DIAGNOSIS — Z0001 Encounter for general adult medical examination with abnormal findings: Secondary | ICD-10-CM | POA: Diagnosis not present

## 2018-10-05 DIAGNOSIS — J0110 Acute frontal sinusitis, unspecified: Secondary | ICD-10-CM

## 2018-10-05 DIAGNOSIS — F339 Major depressive disorder, recurrent, unspecified: Secondary | ICD-10-CM | POA: Diagnosis not present

## 2018-10-05 MED ORDER — METFORMIN HCL 500 MG PO TABS: 500.0000 mg | ORAL_TABLET | Freq: Every day | ORAL | 5 refills | Status: AC

## 2018-10-05 MED ORDER — FLUOXETINE HCL 40 MG PO CAPS: ORAL_CAPSULE | ORAL | 3 refills | Status: AC

## 2018-10-05 MED ORDER — BUPROPION HCL ER (XL) 150 MG PO TB24: 150.0000 mg | ORAL_TABLET | Freq: Every day | ORAL | 5 refills | Status: DC

## 2018-10-08 DIAGNOSIS — F339 Major depressive disorder, recurrent, unspecified: Secondary | ICD-10-CM | POA: Insufficient documentation

## 2018-10-08 NOTE — Progress Notes (Signed)
BP 126/88   Pulse (!) 103   Temp (!) 96.7 F (35.9 C) (Oral)   Wt (!) 334 lb 12.8 oz (151.9 kg)   BMI 55.71 kg/m    Subjective:    Patient ID: Tiffany Dalton, female    DOB: [birth year]o.   MRN: [id number]  HPI: Tiffany Dalton is a 23 y.o. female presenting on 10/05/2018 for Annual Exam  This patient comes in for annual well physical examination. All medications are reviewed today. There are no reports of any problems with the medications. All of the medical conditions are reviewed and updated.  Lab work is reviewed and will be ordered as medically necessary. There are no new problems reported with today's visit.  Patient reports doing well overall.  This patient has had many days of sinus headache and postnasal drainage. There is copious drainage at times. Denies any fever at this time. There has been a history of sinus infections in the past.  No history of sinus surgery. There is cough at night. It has become more prevalent in recent days.  Also recheck on weight gain and depression. She has not been taking any medications or worked on dietary changes due to some life stressors. Depression screen [facility] 2/9 10/05/2018 10/13/2017 08/18/2017 07/19/2017  Decreased Interest 0 0 0 0  Down, Depressed, Hopeless 0 0 0 0  PHQ - 2 Score 0 0 0 0      Past Medical History:  Diagnosis Date  . Allergy   . Anxiety   . Depression    Relevant past medical, surgical, family and social history reviewed and updated as indicated. Interim medical history since our last visit reviewed. Allergies and medications reviewed and updated. DATA REVIEWED: CHART IN EPIC  Family History reviewed for pertinent findings.  Review of Systems  Constitutional: Positive for chills and fatigue. Negative for activity change, appetite change and fever.  HENT: Positive for congestion, postnasal drip, sinus pain and sore throat.   Eyes: Negative.   Respiratory: Negative for cough and wheezing.   Cardiovascular:  Negative.  Negative for chest pain, palpitations and leg swelling.  Gastrointestinal: Negative.   Genitourinary: Negative.   Musculoskeletal: Negative.   Skin: Negative.   Neurological: Positive for headaches.    Allergies as of 10/05/2018   No Known Allergies     Medication List       Accurate as of October 05, 2018 11:59 PM. Always use your most recent med list.        amoxicillin 500 MG capsule Commonly known as:  AMOXIL Take 500 mg by mouth 3 (three) times daily.   buPROPion 150 MG 24 hr tablet Commonly known as:  WELLBUTRIN XL Take 1-2 tablets (150-300 mg total) by mouth daily. Needs to be seen   cetirizine 10 MG tablet Commonly known as:  ZYRTEC Take by mouth.   FLUoxetine 40 MG capsule Commonly known as:  PROZAC TAKE 1 CAPSULE BY MOUTH ONCE DAILY   levonorgestrel 20 MCG/24HR IUD Commonly known as:  MIRENA 1 each by Intrauterine route once.   metFORMIN 500 MG tablet Commonly known as:  GLUCOPHAGE Take 1 tablet (500 mg total) by mouth daily.          Objective:    BP 126/88   Pulse (!) 103   Temp (!) 96.7 F (35.9 C) (Oral)   Wt (!) 334 lb 12.8 oz (151.9 kg)   BMI 55.71 kg/m   No Known Allergies  Wt Readings from  Last 3 Encounters:  10/05/18 (!) 334 lb 12.8 oz (151.9 kg)  10/13/17 (!) 301 lb 6.4 oz (136.7 kg)  08/18/17 (!) 310 lb 6.4 oz (140.8 kg)    Physical Exam Vitals signs and nursing note reviewed.  Constitutional:      Appearance: She is well-developed.  HENT:     Head: Normocephalic and atraumatic.     Right Ear: A middle ear effusion is present.     Left Ear: A middle ear effusion is present.     Nose: Mucosal edema present.     Right Sinus: No frontal sinus tenderness.     Left Sinus: No frontal sinus tenderness.     Mouth/Throat:     Pharynx: Posterior oropharyngeal erythema present. No oropharyngeal exudate.     Tonsils: No tonsillar abscesses.  Eyes:     Conjunctiva/sclera: Conjunctivae normal.     Pupils: Pupils are equal,  round, and reactive to light.  Neck:     Musculoskeletal: Normal range of motion and neck supple.  Cardiovascular:     Rate and Rhythm: Normal rate and regular rhythm.     Heart sounds: Normal heart sounds.  Pulmonary:     Effort: Pulmonary effort is normal.     Breath sounds: Normal breath sounds.  Chest:     Breasts: Breasts are symmetrical.        Right: No mass, skin change or tenderness.        Left: No mass, skin change or tenderness.  Abdominal:     General: Bowel sounds are normal.     Palpations: Abdomen is soft.  Genitourinary:    Labia:        Right: No tenderness or lesion.        Left: No tenderness or lesion.      Vagina: Normal. No vaginal discharge, tenderness or bleeding.     Cervix: No cervical motion tenderness, discharge or friability.     Uterus: Not deviated, not enlarged and not tender.      Adnexa:        Right: No mass, tenderness or fullness.         Left: No mass, tenderness or fullness.       Rectum: No anal fissure.  Skin:    General: Skin is warm and dry.     Findings: No rash.  Neurological:     Mental Status: She is alert and oriented to person, place, and time.     Deep Tendon Reflexes: Reflexes are normal and symmetric.  Psychiatric:        Behavior: Behavior normal.        Thought Content: Thought content normal.        Judgment: Judgment normal.     Results for orders placed or performed in visit on 11/14/17  HM DIABETES EYE EXAM  Result Value Ref Range   HM Diabetic Eye Exam No Retinopathy No Retinopathy      Assessment & Plan:   1. Wellness examination - Pap IG, CT/NG w/ reflex HPV when ASC-U  2. Weight gain - buPROPion (WELLBUTRIN XL) 150 MG 24 hr tablet; Take 1-2 tablets (150-300 mg total) by mouth daily. Needs to be seen  Dispense: 60 tablet; Refill: 5 - metFORMIN (GLUCOPHAGE) 500 MG tablet; Take 1 tablet (500 mg total) by mouth daily.  Dispense: 60 tablet; Refill: 5  3. Depression, recurrent (HCC) - FLUoxetine (PROZAC)  40 MG capsule; TAKE 1 CAPSULE BY MOUTH ONCE DAILY  Dispense: 90 capsule;  Refill: 3  4. Acute non-recurrent frontal sinusitis Amoxicillin zyrtec   Continue all other maintenance medications as listed above.  Follow up plan: Return in about 3 months (around 01/05/2019).  Educational handout given for survey  Remus Loffler PA-C [facility] [address]  [place], [place] [address] [phone]   10/08/2018, 8:55 PM

## 2018-10-10 LAB — PAP IG, CT-NG, RFX HPV ASCU
CHLAMYDIA, NUC. ACID AMP: NEGATIVE
GONOCOCCUS BY NUCLEIC ACID AMP: NEGATIVE

## 2018-10-18 ENCOUNTER — Telehealth: Payer: Self-pay | Admitting: Physician Assistant

## 2018-10-19 ENCOUNTER — Encounter: Payer: Self-pay | Admitting: Physician Assistant

## 2018-10-19 ENCOUNTER — Other Ambulatory Visit: Payer: Self-pay

## 2018-10-19 ENCOUNTER — Ambulatory Visit: Payer: BLUE CROSS/BLUE SHIELD | Admitting: Physician Assistant

## 2018-10-19 VITALS — BP 130/94 | HR 117 | Temp 99.1°F | Ht 65.0 in

## 2018-10-19 DIAGNOSIS — F5101 Primary insomnia: Secondary | ICD-10-CM

## 2018-10-19 DIAGNOSIS — F419 Anxiety disorder, unspecified: Secondary | ICD-10-CM | POA: Diagnosis not present

## 2018-10-19 MED ORDER — CLONAZEPAM 0.5 MG PO TABS: 0.5000 mg | ORAL_TABLET | Freq: Two times a day (BID) | ORAL | 1 refills | Status: DC | PRN

## 2018-10-21 DIAGNOSIS — F419 Anxiety disorder, unspecified: Secondary | ICD-10-CM | POA: Insufficient documentation
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
## 2018-11-07 ENCOUNTER — Ambulatory Visit: Payer: BLUE CROSS/BLUE SHIELD | Admitting: Physician Assistant

## 2018-11-14 ENCOUNTER — Encounter: Payer: Self-pay | Admitting: Physician Assistant

## 2018-11-14 ENCOUNTER — Other Ambulatory Visit: Payer: Self-pay | Admitting: Physician Assistant

## 2018-11-14 DIAGNOSIS — F419 Anxiety disorder, unspecified: Secondary | ICD-10-CM

## 2018-11-14 DIAGNOSIS — F5101 Primary insomnia: Secondary | ICD-10-CM

## 2018-11-14 DIAGNOSIS — L981 Factitial dermatitis: Secondary | ICD-10-CM

## 2018-11-14 DIAGNOSIS — F424 Excoriation (skin-picking) disorder: Secondary | ICD-10-CM
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
## 2018-11-26 ENCOUNTER — Ambulatory Visit (INDEPENDENT_AMBULATORY_CARE_PROVIDER_SITE_OTHER): Payer: BLUE CROSS/BLUE SHIELD | Admitting: Psychiatry

## 2018-11-26 ENCOUNTER — Telehealth (HOSPITAL_COMMUNITY): Payer: Self-pay | Admitting: Psychiatry

## 2018-11-26 ENCOUNTER — Encounter (HOSPITAL_COMMUNITY): Payer: Self-pay | Admitting: Psychiatry

## 2018-11-26 ENCOUNTER — Other Ambulatory Visit: Payer: Self-pay

## 2018-11-26 DIAGNOSIS — F419 Anxiety disorder, unspecified: Secondary | ICD-10-CM | POA: Diagnosis not present

## 2018-11-26 DIAGNOSIS — F331 Major depressive disorder, recurrent, moderate: Secondary | ICD-10-CM | POA: Diagnosis not present

## 2018-11-26 MED ORDER — CLONAZEPAM 0.5 MG PO TABS: 0.5000 mg | ORAL_TABLET | Freq: Two times a day (BID) | ORAL | 1 refills | Status: DC | PRN

## 2018-11-26 MED ORDER — FLUOXETINE HCL 20 MG PO CAPS: ORAL_CAPSULE | ORAL | 1 refills | Status: DC

## 2018-11-26 NOTE — Telephone Encounter (Signed)
D:  Dr. Vanetta Shawl referred pt to [facility].  A:  Placed call to orient pt and provide her with a start date.  Pt declined at this time; stating that she would like "an in person group instead."  Reiterated COVID-19 social distancing practices with pt.  Informed pt to check back in a couple of weeks with Clinical research associate.  Inform Dr. Vanetta Shawl.  R:  Pt receptive.

## 2018-11-26 NOTE — Patient Instructions (Addendum)
1. Increase fluoxetine 60 mg daily  2. Continue Bupropion 150 mg daily 3. Continue clonazepam 0.5 mg 1-2 times per day 4. Referral to IOP 5. Obtain Labs (TSH, CBC)  6. Next appointment: 6/1 at 4 PM for 30 mins

## 2018-11-28 ENCOUNTER — Ambulatory Visit: Payer: Self-pay | Admitting: Child and Adolescent Psychiatry

## 2018-12-25 ENCOUNTER — Other Ambulatory Visit (HOSPITAL_COMMUNITY): Payer: Self-pay | Admitting: Psychiatry

## 2018-12-25 DIAGNOSIS — H53009 Unspecified amblyopia, unspecified eye: Secondary | ICD-10-CM | POA: Diagnosis not present

## 2018-12-25 DIAGNOSIS — Z135 Encounter for screening for eye and ear disorders: Secondary | ICD-10-CM | POA: Diagnosis not present
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
## 2018-12-27 ENCOUNTER — Telehealth (HOSPITAL_COMMUNITY): Payer: Self-pay | Admitting: *Deleted

## 2018-12-27 ENCOUNTER — Other Ambulatory Visit (HOSPITAL_COMMUNITY): Payer: Self-pay | Admitting: Psychiatry

## 2018-12-27 MED ORDER — FLUOXETINE HCL 20 MG PO CAPS: ORAL_CAPSULE | ORAL | 0 refills | Status: DC

## 2018-12-27 NOTE — Telephone Encounter (Signed)
ordered

## 2018-12-27 NOTE — Telephone Encounter (Signed)
Dr Vanetta Shawl Patient called stating that she went to pick her Rx Prozac  20 mg & when she got home the prescription was missing. She called [facility] asking if anyone turned in a lost prescription? Answer was NO .& they told her to ask for new Rx to be sent patient has a appt on  12-31-2018

## 2018-12-31 ENCOUNTER — Ambulatory Visit (INDEPENDENT_AMBULATORY_CARE_PROVIDER_SITE_OTHER): Payer: BLUE CROSS/BLUE SHIELD | Admitting: Psychiatry

## 2018-12-31 ENCOUNTER — Other Ambulatory Visit: Payer: Self-pay

## 2018-12-31 ENCOUNTER — Encounter (HOSPITAL_COMMUNITY): Payer: Self-pay | Admitting: Psychiatry

## 2018-12-31 DIAGNOSIS — F330 Major depressive disorder, recurrent, mild: Secondary | ICD-10-CM

## 2018-12-31 DIAGNOSIS — F419 Anxiety disorder, unspecified: Secondary | ICD-10-CM

## 2018-12-31 MED ORDER — FLUOXETINE HCL 20 MG PO CAPS: ORAL_CAPSULE | ORAL | 0 refills | Status: DC

## 2018-12-31 MED ORDER — CLONAZEPAM 0.5 MG PO TABS: 0.5000 mg | ORAL_TABLET | Freq: Two times a day (BID) | ORAL | 2 refills | Status: DC | PRN

## 2018-12-31 NOTE — Patient Instructions (Signed)
1. Continue fluoxetine 60 mg (40 mg + 20 mg) daily  2. Continue Bupropion 150 mg daily  3. Continue clonazepam 0.5 mg 1-2 times per day 4. Obtain blood test (TSH, CBC) to rule out medical cause of depression 6. Next appointment: 8/10 at 1:20

## 2019-01-03 ENCOUNTER — Other Ambulatory Visit: Payer: Self-pay

## 2019-01-04 ENCOUNTER — Ambulatory Visit (INDEPENDENT_AMBULATORY_CARE_PROVIDER_SITE_OTHER): Payer: BLUE CROSS/BLUE SHIELD | Admitting: Physician Assistant

## 2019-01-04 ENCOUNTER — Encounter: Payer: Self-pay | Admitting: Physician Assistant

## 2019-01-04 VITALS — BP 128/85 | HR 96 | Temp 97.1°F | Ht 65.0 in | Wt 329.0 lb

## 2019-01-04 DIAGNOSIS — Z020 Encounter for examination for admission to educational institution: Secondary | ICD-10-CM

## 2019-01-04 DIAGNOSIS — F419 Anxiety disorder, unspecified: Secondary | ICD-10-CM

## 2019-01-04 DIAGNOSIS — Z6841 Body Mass Index (BMI) 40.0 and over, adult: Secondary | ICD-10-CM

## 2019-01-04 DIAGNOSIS — R635 Abnormal weight gain: Secondary | ICD-10-CM | POA: Diagnosis not present

## 2019-01-04 DIAGNOSIS — F339 Major depressive disorder, recurrent, unspecified: Secondary | ICD-10-CM | POA: Diagnosis not present

## 2019-01-04 DIAGNOSIS — Z111 Encounter for screening for respiratory tuberculosis: Secondary | ICD-10-CM | POA: Diagnosis not present

## 2019-01-04 MED ORDER — BUPROPION HCL ER (XL) 150 MG PO TB24: 150.0000 mg | ORAL_TABLET | Freq: Every day | ORAL | 5 refills | Status: AC

## 2019-01-04 NOTE — Progress Notes (Signed)
BP 128/85   Pulse 96   Temp (!) 97.1 F (36.2 C) (Oral)   Ht 5\' 5"  (1.651 m)   Wt (!) 329 lb (149.2 kg)   BMI 54.75 kg/m    Subjective:    Patient ID: Tiffany Dalton, female    DOB: [birth year], 23 y.o.   MRN: [id number]  HPI: Tiffany Rongnna N Wicklund is a 23 y.o. female presenting on 01/04/2019 for Medical Management of Chronic Issues  Patient comes in for periodic recheck on her chronic medical conditions.  She is continuing to deal with depression and anxiety.  She does have an appointment with psychiatry tomorrow.  She feels like overall her anxiety is coming down some.  There was a lot of stress at the last visit where she was uncertain about work and where she was going to live.  She also had plan to go to grad school.  At this point she has obtained a new job and does have plans to move.  She also anticipates working on good food choices and walking because she will be living on her own.  All of her medications are reviewed and refills will be sent if needed.  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  So TB test was performed for the encounter for school examination.  Depression [facility] 01/04/2019 10/19/2018 10/05/2018 10/13/2017 08/18/2017  Decreased Interest 0 0 0 0 0  Down, Depressed, Hopeless 0 0 0 0 0  PHQ - 2 Score 0 0 0 0 0     Past Medical History:  Diagnosis Date  . Allergy   . Anxiety   . Depression    Relevant past medical, surgical, family and social history reviewed and updated as indicated. Interim medical history since our last visit reviewed. Allergies and medications reviewed and updated. DATA REVIEWED: CHART IN EPIC  Family History reviewed for pertinent findings.  Review of Systems  Constitutional: Negative.   HENT: Negative.   Eyes: Negative.   Respiratory: Negative.   Gastrointestinal: Negative.   Genitourinary: Negative.     Allergies as of 01/04/2019   No Known Allergies     Medication List       Accurate as of January 04, 2019 11:59 PM. If you have any questions, ask your nurse or doctor.        buPROPion 150 MG 24 hr tablet Commonly known as:  WELLBUTRIN XL Take 1-2 tablets (150-300 mg total) by mouth daily. Needs to be seen   cetirizine 10 MG tablet Commonly known as:  ZYRTEC Take by mouth.   clonazePAM 0.5 MG tablet Commonly known as:  KLONOPIN Take 1 tablet (0.5 mg total) by mouth 2 (two) times daily as needed for anxiety. Start taking on:  January 24, 2019   FLUoxetine 40 MG capsule Commonly known as:  PROZAC TAKE 1 CAPSULE BY MOUTH ONCE DAILY   FLUoxetine 20 MG capsule Commonly known as:  PROZAC 60 mg daily (40 mg + 20 mg)   levonorgestrel 20 MCG/24HR IUD Commonly known as:  MIRENA 1 each by Intrauterine route once.   metFORMIN 500 MG tablet Commonly known as:  GLUCOPHAGE Take 1 tablet (500 mg total) by mouth daily.          Objective:    BP 128/85   Pulse 96   Temp (!) 97.1 F (36.2 C) (Oral)   Ht 5\' 5"  (1.651 m)   Wt (!) 329 lb (  149.2 kg)   BMI 54.75 kg/m   No Known Allergies  Wt Readings from Last 3 Encounters:  01/04/19 (!) 329 lb (149.2 kg)  10/05/18 (!) 334 lb 12.8 oz (151.9 kg)  10/13/17 (!) 301 lb 6.4 oz (136.7 kg)    Physical Exam Constitutional:      Appearance: She is well-developed.  HENT:     Head: Normocephalic and atraumatic.  Eyes:     Conjunctiva/sclera: Conjunctivae normal.     Pupils: Pupils are equal, round, and reactive to light.  Cardiovascular:     Rate and Rhythm: Normal rate and regular rhythm.     Heart sounds: Normal heart sounds.  Pulmonary:     Effort: Pulmonary effort is normal.  Skin:    General: Skin is warm and dry.     Findings: No rash.  Neurological:     Mental Status: She is alert and oriented to person, place, and time.     Deep Tendon Reflexes: Reflexes are normal and symmetric.  Psychiatric:        Behavior: Behavior normal.        Thought Content: Thought content normal.        Judgment:  Judgment normal.     Results for orders placed or performed in visit on 01/04/19  QuantiFERON-TB Gold Plus  Result Value Ref Range   QuantiFERON Incubation CANCELED    QuantiFERON-TB Gold Plus CANCELED   CBC  Result Value Ref Range   WBC 7.2 3.4 - 10.8 x10E3/uL   RBC 5.33 (H) 3.77 - 5.28 x10E6/uL   Hemoglobin 14.5 11.1 - 15.9 g/dL   Hematocrit 84.6 65.9 - 46.6 %   MCV 87 79 - 97 fL   MCH 27.2 26.6 - 33.0 pg   MCHC 31.3 (L) 31.5 - 35.7 g/dL   RDW 93.5 70.1 - 77.9 %   Platelets 386 150 - 450 x10E3/uL  TSH  Result Value Ref Range   TSH 2.660 0.450 - 4.500 uIU/mL  QuantiFERON-TB Gold Plus  Result Value Ref Range   QuantiFERON Incubation WILL FOLLOW    QuantiFERON Criteria WILL FOLLOW    QuantiFERON TB1 Ag Value WILL FOLLOW    QuantiFERON TB2 Ag Value WILL FOLLOW    QuantiFERON Nil Value WILL FOLLOW    QuantiFERON Mitogen Value WILL FOLLOW    QuantiFERON-TB Gold Plus WILL FOLLOW   Specimen status report  Result Value Ref Range   specimen status report Comment       Assessment & Plan:   1. Weight gain - buPROPion (WELLBUTRIN XL) 150 MG 24 hr tablet; Take 1-2 tablets (150-300 mg total) by mouth daily. Needs to be seen  Dispense: 60 tablet; Refill: 5  2. Encounter for school examination - QuantiFERON-TB Gold Plus - QuantiFERON-TB Gold Plus - Specimen status report  3. Anxiety - CBC - TSH  4. Depression, recurrent (HCC) - buPROPion (WELLBUTRIN XL) 150 MG 24 hr tablet; Take 1-2 tablets (150-300 mg total) by mouth daily. Needs to be seen  Dispense: 60 tablet; Refill: 5  5. BMI 50.0-59.9, adult (HCC) Walking planned   Continue all other maintenance medications as listed above.  Follow up plan: Return in about 6 months (around 07/06/2019).  Educational handout given for survey  Remus Loffler PA-C [facility] [address]  [place], [place] [address] [phone]   01/08/2019, 9:09 AM

## 2019-01-05 LAB — CBC
Hematocrit: 46.3 % (ref 34.0–46.6)
Hemoglobin: 14.5 g/dL (ref 11.1–15.9)
MCH: 27.2 pg (ref 26.6–33.0)
MCHC: 31.3 g/dL — ABNORMAL LOW (ref 31.5–35.7)
MCV: 87 fL (ref 79–97)
Platelets: 386 10*3/uL (ref 150–450)
RBC: 5.33 x10E6/uL — ABNORMAL HIGH (ref 3.77–5.28)
RDW: 13.9 % (ref 11.7–15.4)
WBC: 7.2 10*3/uL (ref 3.4–10.8)

## 2019-01-05 LAB — TSH: TSH: 2.66 u[IU]/mL (ref 0.450–4.500)

## 2019-01-06 LAB — SPECIMEN STATUS REPORT

## 2019-01-07 ENCOUNTER — Ambulatory Visit: Payer: BLUE CROSS/BLUE SHIELD | Admitting: Physician Assistant

## 2019-01-07 LAB — QUANTIFERON-TB GOLD PLUS

## 2019-01-08 ENCOUNTER — Encounter: Payer: Self-pay | Admitting: Physician Assistant

## 2019-01-09 LAB — QUANTIFERON-TB GOLD PLUS
QuantiFERON Mitogen Value: >10 IU/mL
QuantiFERON Nil Value: 0.03 IU/mL
QuantiFERON TB1 Ag Value: 0.02 IU/mL
QuantiFERON TB2 Ag Value: 0.02 IU/mL
QuantiFERON-TB Gold Plus: NEGATIVE

## 2019-02-08 DIAGNOSIS — Z20828 Contact with and (suspected) exposure to other viral communicable diseases: Secondary | ICD-10-CM | POA: Diagnosis not present

## 2019-02-09 DIAGNOSIS — Z7189 Other specified counseling: Secondary | ICD-10-CM | POA: Diagnosis not present

## 2019-02-09 DIAGNOSIS — Z20828 Contact with and (suspected) exposure to other viral communicable diseases: Secondary | ICD-10-CM | POA: Diagnosis not present

## 2019-02-18 DIAGNOSIS — H18622 Keratoconus, unstable, left eye: Secondary | ICD-10-CM | POA: Diagnosis not present

## 2019-02-18 DIAGNOSIS — H5462 Unqualified visual loss, left eye, normal vision right eye: Secondary | ICD-10-CM | POA: Diagnosis not present

## 2019-02-21 DIAGNOSIS — Z20828 Contact with and (suspected) exposure to other viral communicable diseases: Secondary | ICD-10-CM | POA: Diagnosis not present

## 2019-02-21 DIAGNOSIS — H18622 Keratoconus, unstable, left eye: Secondary | ICD-10-CM | POA: Diagnosis not present

## 2019-02-21 DIAGNOSIS — Z7189 Other specified counseling: Secondary | ICD-10-CM | POA: Diagnosis not present

## 2019-02-21 DIAGNOSIS — R51 Headache: Secondary | ICD-10-CM | POA: Diagnosis not present

## 2019-02-21 DIAGNOSIS — J029 Acute pharyngitis, unspecified: Secondary | ICD-10-CM | POA: Diagnosis not present

## 2019-03-06 DIAGNOSIS — Z114 Encounter for screening for human immunodeficiency virus [HIV]: Secondary | ICD-10-CM | POA: Diagnosis not present

## 2019-03-06 DIAGNOSIS — Z113 Encounter for screening for infections with a predominantly sexual mode of transmission: Secondary | ICD-10-CM | POA: Diagnosis not present
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
## 2019-03-11 ENCOUNTER — Other Ambulatory Visit: Payer: Self-pay

## 2019-03-11 ENCOUNTER — Ambulatory Visit (INDEPENDENT_AMBULATORY_CARE_PROVIDER_SITE_OTHER): Payer: BC Managed Care – PPO | Admitting: Psychiatry

## 2019-03-11 ENCOUNTER — Encounter (HOSPITAL_COMMUNITY): Payer: Self-pay | Admitting: Psychiatry

## 2019-03-11 DIAGNOSIS — F419 Anxiety disorder, unspecified: Secondary | ICD-10-CM

## 2019-03-11 DIAGNOSIS — F330 Major depressive disorder, recurrent, mild: Secondary | ICD-10-CM

## 2019-03-11 MED ORDER — CLONAZEPAM 0.5 MG PO TABS: 0.5000 mg | ORAL_TABLET | Freq: Two times a day (BID) | ORAL | 1 refills | Status: DC | PRN

## 2019-03-11 MED ORDER — FLUOXETINE HCL 20 MG PO CAPS: ORAL_CAPSULE | ORAL | 0 refills | Status: DC

## 2019-03-11 NOTE — Patient Instructions (Signed)
1. Continuefluoxetine60 mg (40 mg + 20 mg) daily 2. ContinueBupropion 150 mg daily  3. Continueclonazepam 0.5 mg 1-2 times per day  4. Next appointment: 10/5 at 3:20

## 2019-04-02 DIAGNOSIS — Z713 Dietary counseling and surveillance: Secondary | ICD-10-CM | POA: Diagnosis not present

## 2019-04-11 DIAGNOSIS — Z713 Dietary counseling and surveillance: Secondary | ICD-10-CM | POA: Diagnosis not present

## 2019-04-29 DIAGNOSIS — Z713 Dietary counseling and surveillance: Secondary | ICD-10-CM | POA: Diagnosis not present
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
## 2019-05-06 ENCOUNTER — Other Ambulatory Visit: Payer: Self-pay

## 2019-05-06 ENCOUNTER — Encounter (HOSPITAL_COMMUNITY): Payer: Self-pay | Admitting: Psychiatry

## 2019-05-06 ENCOUNTER — Ambulatory Visit (INDEPENDENT_AMBULATORY_CARE_PROVIDER_SITE_OTHER): Payer: BC Managed Care – PPO | Admitting: Psychiatry

## 2019-05-06 DIAGNOSIS — F331 Major depressive disorder, recurrent, moderate: Secondary | ICD-10-CM | POA: Diagnosis not present

## 2019-05-06 DIAGNOSIS — F419 Anxiety disorder, unspecified: Secondary | ICD-10-CM

## 2019-05-06 DIAGNOSIS — Z713 Dietary counseling and surveillance: Secondary | ICD-10-CM | POA: Diagnosis not present

## 2019-05-06 MED ORDER — FLUOXETINE HCL 20 MG PO CAPS: ORAL_CAPSULE | ORAL | 0 refills | Status: AC

## 2019-05-06 MED ORDER — CLONAZEPAM 0.5 MG PO TABS: 0.5000 mg | ORAL_TABLET | Freq: Two times a day (BID) | ORAL | 1 refills | Status: AC | PRN

## 2019-05-06 MED ORDER — BUPROPION HCL ER (XL) 300 MG PO TB24: 300.0000 mg | ORAL_TABLET | Freq: Every day | ORAL | 0 refills | Status: AC

## 2019-05-06 NOTE — Patient Instructions (Signed)
1.Continuefluoxetine60 mg(40 mg + 20 mg)daily 2. IncreaseBupropion 300 mg daily  3. Continueclonazepam 0.5 mg 1-2 times per day   4. Next appointment: in December  5. Consider finding a psychiatrist and therapist in your area

## 2019-05-11 DIAGNOSIS — Z23 Encounter for immunization: Secondary | ICD-10-CM | POA: Diagnosis not present

## 2019-05-20 DIAGNOSIS — F5089 Other specified eating disorder: Secondary | ICD-10-CM | POA: Diagnosis not present

## 2019-05-30 DIAGNOSIS — F5089 Other specified eating disorder: Secondary | ICD-10-CM | POA: Diagnosis not present

## 2019-05-31 DIAGNOSIS — R1011 Right upper quadrant pain: Secondary | ICD-10-CM | POA: Diagnosis not present

## 2019-06-02 DIAGNOSIS — E282 Polycystic ovarian syndrome: Secondary | ICD-10-CM | POA: Diagnosis not present

## 2019-06-02 DIAGNOSIS — Z8379 Family history of other diseases of the digestive system: Secondary | ICD-10-CM | POA: Diagnosis not present

## 2019-06-02 DIAGNOSIS — K760 Fatty (change of) liver, not elsewhere classified: Secondary | ICD-10-CM | POA: Diagnosis not present

## 2019-06-02 DIAGNOSIS — Z6841 Body Mass Index (BMI) 40.0 and over, adult: Secondary | ICD-10-CM | POA: Diagnosis not present

## 2019-06-02 DIAGNOSIS — R110 Nausea: Secondary | ICD-10-CM | POA: Diagnosis not present

## 2019-06-02 DIAGNOSIS — F329 Major depressive disorder, single episode, unspecified: Secondary | ICD-10-CM | POA: Diagnosis not present

## 2019-06-02 DIAGNOSIS — F419 Anxiety disorder, unspecified: Secondary | ICD-10-CM | POA: Diagnosis not present

## 2019-06-02 DIAGNOSIS — K8020 Calculus of gallbladder without cholecystitis without obstruction: Secondary | ICD-10-CM | POA: Diagnosis not present

## 2019-06-02 DIAGNOSIS — Z975 Presence of (intrauterine) contraceptive device: Secondary | ICD-10-CM | POA: Diagnosis not present

## 2019-06-02 DIAGNOSIS — R1013 Epigastric pain: Secondary | ICD-10-CM | POA: Diagnosis not present

## 2019-06-02 DIAGNOSIS — Z79899 Other long term (current) drug therapy: Secondary | ICD-10-CM | POA: Diagnosis not present

## 2019-06-02 DIAGNOSIS — K838 Other specified diseases of biliary tract: Secondary | ICD-10-CM | POA: Diagnosis not present

## 2019-06-02 DIAGNOSIS — R1011 Right upper quadrant pain: Secondary | ICD-10-CM | POA: Diagnosis not present

## 2019-06-02 DIAGNOSIS — R161 Splenomegaly, not elsewhere classified: Secondary | ICD-10-CM | POA: Diagnosis not present

## 2019-06-03 DIAGNOSIS — R1011 Right upper quadrant pain: Secondary | ICD-10-CM | POA: Diagnosis not present

## 2019-06-05 DIAGNOSIS — R1011 Right upper quadrant pain: Secondary | ICD-10-CM | POA: Diagnosis not present

## 2019-06-05 DIAGNOSIS — Z6841 Body Mass Index (BMI) 40.0 and over, adult: Secondary | ICD-10-CM | POA: Diagnosis not present

## 2019-06-12 DIAGNOSIS — R1011 Right upper quadrant pain: Secondary | ICD-10-CM | POA: Diagnosis not present

## 2019-06-13 DIAGNOSIS — F5089 Other specified eating disorder: Secondary | ICD-10-CM | POA: Diagnosis not present

## 2019-06-20 DIAGNOSIS — F5089 Other specified eating disorder: Secondary | ICD-10-CM | POA: Diagnosis not present

## 2019-07-04 DIAGNOSIS — Z20828 Contact with and (suspected) exposure to other viral communicable diseases: Secondary | ICD-10-CM | POA: Diagnosis not present

## 2019-07-04 DIAGNOSIS — J069 Acute upper respiratory infection, unspecified: Secondary | ICD-10-CM | POA: Diagnosis not present

## 2019-07-04 DIAGNOSIS — R52 Pain, unspecified: Secondary | ICD-10-CM | POA: Diagnosis not present

## 2019-07-04 DIAGNOSIS — F5089 Other specified eating disorder: Secondary | ICD-10-CM | POA: Diagnosis not present

## 2019-07-08 ENCOUNTER — Ambulatory Visit (HOSPITAL_COMMUNITY): Payer: BC Managed Care – PPO | Admitting: Psychiatry

## 2019-07-11 DIAGNOSIS — F5089 Other specified eating disorder: Secondary | ICD-10-CM | POA: Diagnosis not present

## 2019-07-12 ENCOUNTER — Ambulatory Visit: Payer: BLUE CROSS/BLUE SHIELD | Admitting: Physician Assistant

## 2019-07-31 DIAGNOSIS — F5089 Other specified eating disorder: Secondary | ICD-10-CM | POA: Diagnosis not present

## 2019-08-01 DIAGNOSIS — G479 Sleep disorder, unspecified: Secondary | ICD-10-CM | POA: Diagnosis not present

## 2019-08-01 DIAGNOSIS — R0683 Snoring: Secondary | ICD-10-CM | POA: Diagnosis not present

## 2019-08-01 DIAGNOSIS — R0681 Apnea, not elsewhere classified: Secondary | ICD-10-CM | POA: Diagnosis not present

## 2019-08-08 DIAGNOSIS — F5089 Other specified eating disorder: Secondary | ICD-10-CM | POA: Diagnosis not present

## 2019-08-15 DIAGNOSIS — G4733 Obstructive sleep apnea (adult) (pediatric): Secondary | ICD-10-CM | POA: Diagnosis not present

## 2019-08-15 DIAGNOSIS — G479 Sleep disorder, unspecified: Secondary | ICD-10-CM | POA: Diagnosis not present

## 2019-08-15 DIAGNOSIS — G4700 Insomnia, unspecified: Secondary | ICD-10-CM | POA: Diagnosis not present

## 2019-08-22 DIAGNOSIS — F5089 Other specified eating disorder: Secondary | ICD-10-CM | POA: Diagnosis not present

## 2019-08-29 DIAGNOSIS — F5089 Other specified eating disorder: Secondary | ICD-10-CM | POA: Diagnosis not present

## 2019-09-05 DIAGNOSIS — F5089 Other specified eating disorder: Secondary | ICD-10-CM | POA: Diagnosis not present

## 2019-09-09 DIAGNOSIS — F411 Generalized anxiety disorder: Secondary | ICD-10-CM | POA: Diagnosis not present

## 2019-09-09 DIAGNOSIS — F331 Major depressive disorder, recurrent, moderate: Secondary | ICD-10-CM | POA: Diagnosis not present

## 2019-09-09 DIAGNOSIS — F4312 Post-traumatic stress disorder, chronic: Secondary | ICD-10-CM | POA: Diagnosis not present

## 2019-09-12 DIAGNOSIS — F5089 Other specified eating disorder: Secondary | ICD-10-CM | POA: Diagnosis not present

## 2019-09-25 DIAGNOSIS — H18622 Keratoconus, unstable, left eye: Secondary | ICD-10-CM | POA: Diagnosis not present

## 2019-09-25 DIAGNOSIS — G4733 Obstructive sleep apnea (adult) (pediatric): Secondary | ICD-10-CM | POA: Diagnosis not present

## 2019-09-26 DIAGNOSIS — F5089 Other specified eating disorder: Secondary | ICD-10-CM | POA: Diagnosis not present

## 2019-10-03 DIAGNOSIS — F5089 Other specified eating disorder: Secondary | ICD-10-CM | POA: Diagnosis not present

## 2019-10-17 DIAGNOSIS — F5089 Other specified eating disorder: Secondary | ICD-10-CM | POA: Diagnosis not present

## 2019-10-23 DIAGNOSIS — G4733 Obstructive sleep apnea (adult) (pediatric): Secondary | ICD-10-CM | POA: Diagnosis not present

## 2019-10-24 DIAGNOSIS — F5089 Other specified eating disorder: Secondary | ICD-10-CM | POA: Diagnosis not present

## 2019-10-31 DIAGNOSIS — F5089 Other specified eating disorder: Secondary | ICD-10-CM | POA: Diagnosis not present

## 2019-11-04 DIAGNOSIS — F411 Generalized anxiety disorder: Secondary | ICD-10-CM | POA: Diagnosis not present

## 2019-11-04 DIAGNOSIS — F4312 Post-traumatic stress disorder, chronic: Secondary | ICD-10-CM | POA: Diagnosis not present

## 2019-11-04 DIAGNOSIS — F331 Major depressive disorder, recurrent, moderate: Secondary | ICD-10-CM | POA: Diagnosis not present

## 2019-11-07 DIAGNOSIS — F5089 Other specified eating disorder: Secondary | ICD-10-CM | POA: Diagnosis not present

## 2019-11-14 DIAGNOSIS — F5089 Other specified eating disorder: Secondary | ICD-10-CM | POA: Diagnosis not present

## 2019-11-23 DIAGNOSIS — G4733 Obstructive sleep apnea (adult) (pediatric): Secondary | ICD-10-CM | POA: Diagnosis not present

## 2019-11-28 DIAGNOSIS — F5089 Other specified eating disorder: Secondary | ICD-10-CM | POA: Diagnosis not present

## 2019-12-23 DIAGNOSIS — G4733 Obstructive sleep apnea (adult) (pediatric): Secondary | ICD-10-CM | POA: Diagnosis not present

## 2019-12-26 DIAGNOSIS — G4733 Obstructive sleep apnea (adult) (pediatric): Secondary | ICD-10-CM | POA: Diagnosis not present

## 2019-12-31 DIAGNOSIS — F411 Generalized anxiety disorder: Secondary | ICD-10-CM | POA: Diagnosis not present

## 2019-12-31 DIAGNOSIS — F331 Major depressive disorder, recurrent, moderate: Secondary | ICD-10-CM | POA: Diagnosis not present

## 2019-12-31 DIAGNOSIS — F4312 Post-traumatic stress disorder, chronic: Secondary | ICD-10-CM | POA: Diagnosis not present

## 2020-01-23 DIAGNOSIS — G4733 Obstructive sleep apnea (adult) (pediatric): Secondary | ICD-10-CM | POA: Diagnosis not present

## 2020-02-22 DIAGNOSIS — G4733 Obstructive sleep apnea (adult) (pediatric): Secondary | ICD-10-CM | POA: Diagnosis not present

## 2020-02-28 DIAGNOSIS — F331 Major depressive disorder, recurrent, moderate: Secondary | ICD-10-CM | POA: Diagnosis not present

## 2020-02-28 DIAGNOSIS — F4312 Post-traumatic stress disorder, chronic: Secondary | ICD-10-CM | POA: Diagnosis not present

## 2020-02-28 DIAGNOSIS — F411 Generalized anxiety disorder: Secondary | ICD-10-CM | POA: Diagnosis not present

## 2020-03-24 DIAGNOSIS — G4733 Obstructive sleep apnea (adult) (pediatric): Secondary | ICD-10-CM | POA: Diagnosis not present

## 2020-04-02 DIAGNOSIS — G4733 Obstructive sleep apnea (adult) (pediatric): Secondary | ICD-10-CM | POA: Diagnosis not present

## 2020-04-12 DIAGNOSIS — R05 Cough: Secondary | ICD-10-CM | POA: Diagnosis not present

## 2020-04-12 DIAGNOSIS — Z20822 Contact with and (suspected) exposure to covid-19: Secondary | ICD-10-CM | POA: Diagnosis not present

## 2020-04-12 DIAGNOSIS — J029 Acute pharyngitis, unspecified: Secondary | ICD-10-CM | POA: Diagnosis not present

## 2020-04-21 DIAGNOSIS — F4312 Post-traumatic stress disorder, chronic: Secondary | ICD-10-CM | POA: Diagnosis not present

## 2020-04-27 DIAGNOSIS — F331 Major depressive disorder, recurrent, moderate: Secondary | ICD-10-CM | POA: Diagnosis not present

## 2020-04-27 DIAGNOSIS — F411 Generalized anxiety disorder: Secondary | ICD-10-CM | POA: Diagnosis not present

## 2020-04-27 DIAGNOSIS — F4312 Post-traumatic stress disorder, chronic: Secondary | ICD-10-CM | POA: Diagnosis not present

## 2020-04-28 DIAGNOSIS — F4312 Post-traumatic stress disorder, chronic: Secondary | ICD-10-CM | POA: Diagnosis not present

## 2020-05-05 DIAGNOSIS — F4312 Post-traumatic stress disorder, chronic: Secondary | ICD-10-CM | POA: Diagnosis not present

## 2020-05-12 DIAGNOSIS — F4312 Post-traumatic stress disorder, chronic: Secondary | ICD-10-CM | POA: Diagnosis not present

## 2020-05-19 DIAGNOSIS — F4312 Post-traumatic stress disorder, chronic: Secondary | ICD-10-CM | POA: Diagnosis not present

## 2020-05-26 DIAGNOSIS — F4312 Post-traumatic stress disorder, chronic: Secondary | ICD-10-CM | POA: Diagnosis not present

## 2020-06-09 DIAGNOSIS — F4312 Post-traumatic stress disorder, chronic: Secondary | ICD-10-CM | POA: Diagnosis not present

## 2020-06-11 DIAGNOSIS — F411 Generalized anxiety disorder: Secondary | ICD-10-CM | POA: Diagnosis not present

## 2020-06-11 DIAGNOSIS — F331 Major depressive disorder, recurrent, moderate: Secondary | ICD-10-CM | POA: Diagnosis not present

## 2020-06-11 DIAGNOSIS — F4312 Post-traumatic stress disorder, chronic: Secondary | ICD-10-CM | POA: Diagnosis not present

## 2020-06-23 DIAGNOSIS — F4312 Post-traumatic stress disorder, chronic: Secondary | ICD-10-CM | POA: Diagnosis not present

## 2020-06-30 DIAGNOSIS — F4312 Post-traumatic stress disorder, chronic: Secondary | ICD-10-CM | POA: Diagnosis not present

## 2020-07-03 DIAGNOSIS — G4733 Obstructive sleep apnea (adult) (pediatric): Secondary | ICD-10-CM | POA: Diagnosis not present

## 2020-07-09 DIAGNOSIS — F331 Major depressive disorder, recurrent, moderate: Secondary | ICD-10-CM | POA: Diagnosis not present

## 2020-07-09 DIAGNOSIS — F4312 Post-traumatic stress disorder, chronic: Secondary | ICD-10-CM | POA: Diagnosis not present

## 2020-07-09 DIAGNOSIS — F411 Generalized anxiety disorder: Secondary | ICD-10-CM | POA: Diagnosis not present

## 2020-07-14 DIAGNOSIS — F4312 Post-traumatic stress disorder, chronic: Secondary | ICD-10-CM | POA: Diagnosis not present

## 2020-07-21 DIAGNOSIS — F4312 Post-traumatic stress disorder, chronic: Secondary | ICD-10-CM | POA: Diagnosis not present

## 2020-08-04 DIAGNOSIS — F4312 Post-traumatic stress disorder, chronic: Secondary | ICD-10-CM | POA: Diagnosis not present

## 2020-08-15 DIAGNOSIS — Z20828 Contact with and (suspected) exposure to other viral communicable diseases: Secondary | ICD-10-CM | POA: Diagnosis not present

## 2020-08-24 DIAGNOSIS — H18622 Keratoconus, unstable, left eye: Secondary | ICD-10-CM | POA: Diagnosis not present

## 2020-08-24 DIAGNOSIS — H5213 Myopia, bilateral: Secondary | ICD-10-CM | POA: Diagnosis not present

## 2020-09-02 DIAGNOSIS — F4312 Post-traumatic stress disorder, chronic: Secondary | ICD-10-CM | POA: Diagnosis not present

## 2020-09-03 DIAGNOSIS — F411 Generalized anxiety disorder: Secondary | ICD-10-CM | POA: Diagnosis not present

## 2020-09-03 DIAGNOSIS — F331 Major depressive disorder, recurrent, moderate: Secondary | ICD-10-CM | POA: Diagnosis not present

## 2020-09-03 DIAGNOSIS — F4312 Post-traumatic stress disorder, chronic: Secondary | ICD-10-CM | POA: Diagnosis not present

## 2020-09-22 DIAGNOSIS — F4312 Post-traumatic stress disorder, chronic: Secondary | ICD-10-CM | POA: Diagnosis not present

## 2020-10-06 DIAGNOSIS — F4312 Post-traumatic stress disorder, chronic: Secondary | ICD-10-CM | POA: Diagnosis not present

## 2020-10-16 DIAGNOSIS — G4733 Obstructive sleep apnea (adult) (pediatric): Secondary | ICD-10-CM | POA: Diagnosis not present

## 2020-10-20 DIAGNOSIS — F4312 Post-traumatic stress disorder, chronic: Secondary | ICD-10-CM | POA: Diagnosis not present

## 2020-11-04 DIAGNOSIS — F4312 Post-traumatic stress disorder, chronic: Secondary | ICD-10-CM | POA: Diagnosis not present

## 2020-11-26 DIAGNOSIS — F4312 Post-traumatic stress disorder, chronic: Secondary | ICD-10-CM | POA: Diagnosis not present

## 2020-11-26 DIAGNOSIS — F411 Generalized anxiety disorder: Secondary | ICD-10-CM | POA: Diagnosis not present

## 2020-11-26 DIAGNOSIS — F331 Major depressive disorder, recurrent, moderate: Secondary | ICD-10-CM | POA: Diagnosis not present

## 2020-12-01 DIAGNOSIS — F4312 Post-traumatic stress disorder, chronic: Secondary | ICD-10-CM | POA: Diagnosis not present

## 2020-12-29 DIAGNOSIS — F4312 Post-traumatic stress disorder, chronic: Secondary | ICD-10-CM | POA: Diagnosis not present

## 2021-01-14 DIAGNOSIS — F4312 Post-traumatic stress disorder, chronic: Secondary | ICD-10-CM | POA: Diagnosis not present

## 2021-01-26 DIAGNOSIS — F4312 Post-traumatic stress disorder, chronic: Secondary | ICD-10-CM | POA: Diagnosis not present

## 2021-02-18 DIAGNOSIS — F411 Generalized anxiety disorder: Secondary | ICD-10-CM | POA: Diagnosis not present

## 2021-02-18 DIAGNOSIS — F331 Major depressive disorder, recurrent, moderate: Secondary | ICD-10-CM | POA: Diagnosis not present

## 2021-02-18 DIAGNOSIS — F4312 Post-traumatic stress disorder, chronic: Secondary | ICD-10-CM | POA: Diagnosis not present

## 2021-03-23 DIAGNOSIS — F4312 Post-traumatic stress disorder, chronic: Secondary | ICD-10-CM | POA: Diagnosis not present
